# Patient Record
Sex: Male | Born: 1980 | Race: Black or African American | Hispanic: Yes | Marital: Married | Smoking: Current every day smoker
Health system: Southern US, Community
[De-identification: ages and names within clinical notes are randomized; demographics above are authoritative.]

## PROBLEM LIST (undated history)

## (undated) HISTORY — PX: OTHER SURGICAL HISTORY: SHX169

---

## 2016-09-25 ENCOUNTER — Emergency Department (HOSPITAL_COMMUNITY)
Admission: EM | Admit: 2016-09-25 | Discharge: 2016-09-25 | Disposition: A | Discharge status: Home / Self Care | Payer: Self-pay | Attending: Emergency Medicine | Admitting: Emergency Medicine

## 2016-09-25 ENCOUNTER — Encounter (HOSPITAL_COMMUNITY): Payer: Self-pay

## 2016-09-25 ENCOUNTER — Emergency Department (HOSPITAL_COMMUNITY): Payer: Self-pay

## 2016-09-25 DIAGNOSIS — X509XXA Other and unspecified overexertion or strenuous movements or postures, initial encounter: Secondary | ICD-10-CM | POA: Insufficient documentation

## 2016-09-25 DIAGNOSIS — R1032 Left lower quadrant pain: Secondary | ICD-10-CM | POA: Insufficient documentation

## 2016-09-25 DIAGNOSIS — M545 Low back pain, unspecified: Secondary | ICD-10-CM

## 2016-09-25 DIAGNOSIS — R1031 Right lower quadrant pain: Secondary | ICD-10-CM | POA: Insufficient documentation

## 2016-09-25 DIAGNOSIS — Y929 Unspecified place or not applicable: Secondary | ICD-10-CM | POA: Insufficient documentation

## 2016-09-25 DIAGNOSIS — Y990 Civilian activity done for income or pay: Secondary | ICD-10-CM | POA: Insufficient documentation

## 2016-09-25 DIAGNOSIS — F17210 Nicotine dependence, cigarettes, uncomplicated: Secondary | ICD-10-CM | POA: Insufficient documentation

## 2016-09-25 DIAGNOSIS — Y939 Activity, unspecified: Secondary | ICD-10-CM | POA: Insufficient documentation

## 2016-09-25 LAB — CBC
HEMATOCRIT: 44.6 % (ref 39.0–52.0)
HEMOGLOBIN: 15.9 g/dL (ref 13.0–17.0)
MCH: 29.7 pg (ref 26.0–34.0)
MCHC: 35.7 g/dL (ref 30.0–36.0)
MCV: 83.4 fL (ref 78.0–100.0)
Platelets: 285 10*3/uL (ref 150–400)
RBC: 5.35 MIL/uL (ref 4.22–5.81)
RDW: 12.4 % (ref 11.5–15.5)
WBC: 5.3 10*3/uL (ref 4.0–10.5)

## 2016-09-25 LAB — URINALYSIS, ROUTINE W REFLEX MICROSCOPIC
Bilirubin Urine: NEGATIVE
GLUCOSE, UA: NEGATIVE mg/dL
Hgb urine dipstick: NEGATIVE
Ketones, ur: NEGATIVE mg/dL
LEUKOCYTES UA: NEGATIVE
NITRITE: NEGATIVE
PROTEIN: NEGATIVE mg/dL
Specific Gravity, Urine: 1.016 (ref 1.005–1.030)
pH: 7 (ref 5.0–8.0)

## 2016-09-25 LAB — BASIC METABOLIC PANEL
ANION GAP: 6 (ref 5–15)
BUN: 13 mg/dL (ref 6–20)
CALCIUM: 9.7 mg/dL (ref 8.9–10.3)
CO2: 28 mmol/L (ref 22–32)
Chloride: 104 mmol/L (ref 101–111)
Creatinine, Ser: 0.84 mg/dL (ref 0.61–1.24)
Glucose, Bld: 98 mg/dL (ref 65–99)
POTASSIUM: 4.1 mmol/L (ref 3.5–5.1)
Sodium: 138 mmol/L (ref 135–145)

## 2016-09-25 MED ORDER — CYCLOBENZAPRINE HCL 10 MG PO TABS: 10.0000 mg | ORAL_TABLET | Freq: Two times a day (BID) | ORAL | 0 refills | Status: DC | PRN

## 2016-09-25 MED ORDER — MORPHINE SULFATE (PF) 4 MG/ML IV SOLN
4.0000 mg | Freq: Once | INTRAVENOUS | Status: AC
  Administered 2016-09-25: 4 mg via INTRAVENOUS
  Filled 2016-09-25: qty 1

## 2016-09-25 MED ORDER — IOPAMIDOL (ISOVUE-300) INJECTION 61%
100.0000 mL | Freq: Once | INTRAVENOUS | Status: AC | PRN
  Administered 2016-09-25: 100 mL via INTRAVENOUS

## 2016-09-25 MED ORDER — TRAMADOL HCL 50 MG PO TABS: 50.0000 mg | ORAL_TABLET | Freq: Four times a day (QID) | ORAL | 0 refills | Status: DC | PRN

## 2016-09-25 MED ORDER — CYCLOBENZAPRINE HCL 10 MG PO TABS
5.0000 mg | ORAL_TABLET | Freq: Once | ORAL | Status: AC
  Administered 2016-09-25: 5 mg via ORAL
  Filled 2016-09-25: qty 1

## 2016-09-25 MED ORDER — IOPAMIDOL (ISOVUE-300) INJECTION 61%
INTRAVENOUS | Status: AC
  Filled 2016-09-25: qty 100

## 2016-09-25 MED ORDER — KETOROLAC TROMETHAMINE 15 MG/ML IJ SOLN
30.0000 mg | Freq: Once | INTRAMUSCULAR | Status: AC
  Administered 2016-09-25: 30 mg via INTRAVENOUS
  Filled 2016-09-25: qty 2

## 2016-09-25 NOTE — ED Provider Notes (Signed)
[facility]-EMERGENCY DEPT Provider Note   CSN: [id number] Arrival date & time: 09/25/16  1123     History   Chief Complaint Chief Complaint  Patient presents with  . Back Pain    HPI Johnny Kaufman is a 36 y.o. male.  HPI   36 year old male presents today with complaints of back and abdominal pain.  Patient reports symptoms started approximately 2 days ago.  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  Patient notes the symptoms radiate around to the right lower abdomen also left lower abdomen.  Patient notes symptoms are worse with bending or standing.  He denies any distal neurological deficits.  He denies any dysuria, notes minor pain after urination.  He denies any penile discharge or significant testicular pain.  Patient notes that he has been able to eat, but has a decreased appetite.  Patient notes he has had these symptoms in the past, but they were not as severe.  He notes this is located of the right lower back.    No past medical history on file.  There are no active problems to display for this patient.   No past surgical history on file.     Home Medications    Prior to Admission medications   Medication Sig Start Date End Date Taking? Authorizing Provider  vitamin C (ASCORBIC ACID) 500 MG tablet Take 500 mg by mouth daily.   Yes Historical Provider, MD  cyclobenzaprine (FLEXERIL) 10 MG tablet Take 1 tablet (10 mg total) by mouth 2 (two) times daily as needed for muscle spasms. 09/25/16   Eyvonne Mechanic, PA-C  traMADol (ULTRAM) 50 MG tablet Take 1 tablet (50 mg total) by mouth every 6 (six) hours as needed. 09/25/16   Eyvonne Mechanic, PA-C    Family History No family history on file.  Social History Social History  Substance Use Topics  . Smoking status: Current Every Day Smoker    Packs/day: 0.50    Types: Cigarettes  . Smokeless tobacco: Not on file  . Alcohol use Yes     Comment: occasionally      Allergies   Patient has no known allergies.   Review of Systems Review of Systems  All other systems reviewed and are negative.    Physical Exam Updated Vital Signs BP 119/84 (BP Location: Right Arm)   Pulse 79   Resp 16   Ht 5\' 11"  (1.803 m)   Wt 86.2 kg   SpO2 100%   BMI 26.50 kg/m   Physical Exam  Constitutional: He is oriented to person, place, and time. He appears well-developed and well-nourished.  HENT:  Head: Normocephalic and atraumatic.  Eyes: Conjunctivae are normal. Pupils are equal, round, and reactive to light. Right eye exhibits no discharge. Left eye exhibits no discharge. No scleral icterus.  Neck: Normal range of motion. No JVD present. No tracheal deviation present.  Pulmonary/Chest: Effort normal. No stridor.  Abdominal:  Tenderness to palpation of the bilateral lower abdominal region  Musculoskeletal:  No CT or L-spine tenderness.  Exquisite tenderness to palpation of the right lateral lumbar soft tissue.  Full active range of motion of the lower extremities, distal sensation strength and motor function intact.  Neurological: He is alert and oriented to person, place, and time. Coordination normal.  Skin: Skin is warm.  Psychiatric: He has a normal mood and affect. His behavior is normal. Judgment and thought content normal.  Nursing note and  vitals reviewed.    ED Treatments / Results  Labs (all labs ordered are listed, but only abnormal results are displayed) Labs Reviewed  URINALYSIS, ROUTINE W REFLEX MICROSCOPIC  BASIC METABOLIC PANEL  CBC    EKG  EKG Interpretation None       Radiology Ct Abdomen Pelvis W Contrast  Result Date: 09/25/2016 CLINICAL DATA:  Right groin and low back pain EXAM: CT ABDOMEN AND PELVIS WITH CONTRAST TECHNIQUE: Multidetector CT imaging of the abdomen and pelvis was performed using the standard protocol following bolus administration of intravenous contrast. CONTRAST:  ISOVUE-300 IOPAMIDOL  (ISOVUE-300) INJECTION 61% COMPARISON:  None. FINDINGS: Lower chest: Lung bases demonstrate no acute consolidation or pleural effusion. Normal heart size. Hepatobiliary: No focal liver abnormality is seen. No gallstones, gallbladder wall thickening, or biliary dilatation. Pancreas: Unremarkable. No pancreatic ductal dilatation or surrounding inflammatory changes. Spleen: Normal in size without focal abnormality. Adrenals/Urinary Tract: Adrenal glands are within normal limits. Subcentimeter hypodense lesion upper pole left kidney too small to further characterize. No hydronephrosis. The bladder is normal. Stomach/Bowel: Stomach is within normal limits. Appendix appears normal. No evidence of bowel wall thickening, distention, or inflammatory changes. Vascular/Lymphatic: No significant vascular findings are present. No enlarged abdominal or pelvic lymph nodes. Reproductive: Prostate is unremarkable. Other: No abdominal wall hernia or abnormality. No abdominopelvic ascites. Musculoskeletal: Sclerotic lesion in the left iliac bone, could reflect large bone island. No acute osseous abnormality. IMPRESSION: No CT evidence for acute intra-abdominal or pelvic pathology. Normal appendix. Subcentimeter hypodense lesion left kidney, too small to further characterize. Electronically Signed   By: Jasmine Pang M.D.   On: 09/25/2016 15:33    Procedures Procedures (including critical care time)  Medications Ordered in ED Medications  iopamidol (ISOVUE-300) 61 % injection (not administered)  ketorolac (TORADOL) 15 MG/ML injection 30 mg (not administered)  cyclobenzaprine (FLEXERIL) tablet 5 mg (not administered)  morphine 4 MG/ML injection 4 mg (4 mg Intravenous Given 09/25/16 1355)  iopamidol (ISOVUE-300) 61 % injection 100 mL (100 mLs Intravenous Contrast Given 09/25/16 1513)     Initial Impression / Assessment and Plan / ED Course  I have reviewed the triage vital signs and the nursing notes.  Pertinent labs &  imaging results that were available during my care of the patient were reviewed by me and considered in my medical decision making (see chart for details).     Final Clinical Impressions(s) / ED Diagnoses   Final diagnoses:  Acute right-sided low back pain without sciatica    36 year old male presents today with complaints of back pain.  Patient is having abdominal complaints as well.  His urinalysis is normal, CT scan shows no significant findings.  Patient's presentation is likely muscular back pain with radiation to his abdomen.  He has no distal neurological deficits, no red flags for back pain.  Patient's pain is significantly improved here with medications.  He will be discharged home with symptomatic care instructions, medication, close follow-up with orthopedics if symptoms persist, strict return precautions.  Both patient and his visitor verbalized understanding and agreement to today's plan had no further questions or concerns at the time discharge  New Prescriptions New Prescriptions   CYCLOBENZAPRINE (FLEXERIL) 10 MG TABLET    Take 1 tablet (10 mg total) by mouth 2 (two) times daily as needed for muscle spasms.   TRAMADOL (ULTRAM) 50 MG TABLET    Take 1 tablet (50 mg total) by mouth every 6 (six) hours as needed.     Tinnie Gens  Laquita Harlan, PA-C 09/25/16 1609    Gwyneth SproutWhitney Plunkett, MD 09/26/16 1627

## 2016-09-25 NOTE — ED Notes (Signed)
Per translator Harriett Sine (spanish) via [employer]: Pt has had pain to his RT low back and RT groin area.  States it has been an issue for a while but has gotten worse.  Denies pain w/urination but has burning after finished urinating x last 2 days.  Denies penile d/c but c/o blood in stool most times he has a BM.  States otherwise he has a BM daily, last one yesterday w/red blood in it.  States he drinks mostly soda, rarely drinks water.   He walks with a limp, states it is due to the pain.  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

## 2016-09-25 NOTE — Discharge Instructions (Signed)
Please read attached information. If you experience any new or worsening signs or symptoms please return to the emergency room for evaluation. Please follow-up with your primary care provider or specialist as discussed. Please use medication prescribed only as directed and discontinue taking if you have any concerning signs or symptoms.   °

## 2017-08-24 ENCOUNTER — Other Ambulatory Visit: Payer: Self-pay

## 2017-08-24 ENCOUNTER — Emergency Department (HOSPITAL_COMMUNITY): Payer: BLUE CROSS/BLUE SHIELD

## 2017-08-24 ENCOUNTER — Emergency Department (HOSPITAL_COMMUNITY)
Admission: EM | Admit: 2017-08-24 | Discharge: 2017-08-24 | Disposition: A | Discharge status: Home / Self Care | Payer: BLUE CROSS/BLUE SHIELD | Attending: Emergency Medicine | Admitting: Emergency Medicine

## 2017-08-24 ENCOUNTER — Encounter (HOSPITAL_COMMUNITY): Payer: Self-pay | Admitting: *Deleted

## 2017-08-24 DIAGNOSIS — Y939 Activity, unspecified: Secondary | ICD-10-CM | POA: Diagnosis not present

## 2017-08-24 DIAGNOSIS — Y999 Unspecified external cause status: Secondary | ICD-10-CM | POA: Insufficient documentation

## 2017-08-24 DIAGNOSIS — Y929 Unspecified place or not applicable: Secondary | ICD-10-CM | POA: Insufficient documentation

## 2017-08-24 DIAGNOSIS — S39012A Strain of muscle, fascia and tendon of lower back, initial encounter: Secondary | ICD-10-CM

## 2017-08-24 DIAGNOSIS — X58XXXA Exposure to other specified factors, initial encounter: Secondary | ICD-10-CM | POA: Diagnosis not present

## 2017-08-24 DIAGNOSIS — F17210 Nicotine dependence, cigarettes, uncomplicated: Secondary | ICD-10-CM | POA: Diagnosis not present

## 2017-08-24 DIAGNOSIS — M545 Low back pain: Secondary | ICD-10-CM | POA: Diagnosis present

## 2017-08-24 LAB — URINALYSIS, ROUTINE W REFLEX MICROSCOPIC
BILIRUBIN URINE: NEGATIVE
Glucose, UA: NEGATIVE mg/dL
HGB URINE DIPSTICK: NEGATIVE
Ketones, ur: NEGATIVE mg/dL
Leukocytes, UA: NEGATIVE
NITRITE: NEGATIVE
PH: 7 (ref 5.0–8.0)
Protein, ur: NEGATIVE mg/dL
SPECIFIC GRAVITY, URINE: 1.016 (ref 1.005–1.030)

## 2017-08-24 MED ORDER — KETOROLAC TROMETHAMINE 30 MG/ML IJ SOLN
15.0000 mg | Freq: Once | INTRAMUSCULAR | Status: AC
  Administered 2017-08-24: 15 mg via INTRAMUSCULAR
  Filled 2017-08-24: qty 1

## 2017-08-24 MED ORDER — MELOXICAM 7.5 MG PO TABS: 7.5000 mg | ORAL_TABLET | Freq: Every day | ORAL | 0 refills | Status: AC

## 2017-08-24 NOTE — Discharge Instructions (Signed)
Please be sure to schedule an appointment with our clinic to ensure that your condition is improving.  Return here for concerning changes in your condition.

## 2017-08-24 NOTE — ED Triage Notes (Signed)
Lower back pain x 2 weeks. Pain varies from mild to severe. Urinary difficulty at times
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
## 2017-08-26 ENCOUNTER — Encounter: Payer: Self-pay | Admitting: Gastroenterology

## 2017-09-05 ENCOUNTER — Emergency Department (HOSPITAL_COMMUNITY)
Admission: EM | Admit: 2017-09-05 | Discharge: 2017-09-05 | Disposition: A | Discharge status: Home / Self Care | Payer: Worker's Compensation | Attending: Emergency Medicine | Admitting: Emergency Medicine

## 2017-09-05 ENCOUNTER — Encounter (HOSPITAL_COMMUNITY): Payer: Self-pay | Admitting: Obstetrics and Gynecology

## 2017-09-05 ENCOUNTER — Other Ambulatory Visit: Payer: Self-pay

## 2017-09-05 ENCOUNTER — Emergency Department (HOSPITAL_COMMUNITY): Payer: Worker's Compensation

## 2017-09-05 DIAGNOSIS — Y9389 Activity, other specified: Secondary | ICD-10-CM | POA: Diagnosis not present

## 2017-09-05 DIAGNOSIS — Y9259 Other trade areas as the place of occurrence of the external cause: Secondary | ICD-10-CM | POA: Insufficient documentation

## 2017-09-05 DIAGNOSIS — M25561 Pain in right knee: Secondary | ICD-10-CM | POA: Diagnosis not present

## 2017-09-05 DIAGNOSIS — Y990 Civilian activity done for income or pay: Secondary | ICD-10-CM | POA: Insufficient documentation

## 2017-09-05 DIAGNOSIS — S8991XA Unspecified injury of right lower leg, initial encounter: Secondary | ICD-10-CM | POA: Diagnosis present

## 2017-09-05 MED ORDER — IBUPROFEN 800 MG PO TABS
800.0000 mg | ORAL_TABLET | Freq: Once | ORAL | Status: AC
  Administered 2017-09-05: 800 mg via ORAL
  Filled 2017-09-05: qty 1

## 2017-09-05 NOTE — Discharge Instructions (Signed)
Toma ibuprofen 3 veces cada dia.Puede tomar 800 mg cada tiempo. No toma otros medicinas como advil, motrin, naproxen, aleve.  Puede tomar tylenol tambien. Puede tomar 1000 mg cada tiempo, 3 veces cada dia.  [place] hielo, 20 minutos, 3 veces cada dia.  Da [place] Dearuna cita con el doctor ortopedica si el dolor no mejora en [employer]H. Robinson World[employer]una semana.  Regresa a la sala de emergencia si el dolor peora, si no tiene sensation de pierna, si hay cambia de color de piel, o si hay otros problemas nuevos.   Take ibuprofen 3 times a day with meals.  Do not take other anti-inflammatories at the same time open (Advil, Motrin, naproxen, Aleve). You may supplement with Tylenol if you need further pain control. Use ice packs, 20 minutes at a time, 3 times a Futures traderda.  Follow-up with the orthopedic doctor in 1 week if your pain is not improving. Return to the emergency room if you develop worsening pain, numbness, color change, or any new or concerning symptoms.
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
## 2017-10-16 ENCOUNTER — Ambulatory Visit: Payer: BLUE CROSS/BLUE SHIELD | Admitting: Gastroenterology

## 2017-12-16 ENCOUNTER — Encounter (HOSPITAL_COMMUNITY): Payer: Self-pay

## 2017-12-16 ENCOUNTER — Other Ambulatory Visit: Payer: Self-pay

## 2017-12-16 ENCOUNTER — Emergency Department (HOSPITAL_COMMUNITY)
Admission: EM | Admit: 2017-12-16 | Discharge: 2017-12-16 | Disposition: A | Discharge status: Home / Self Care | Payer: BLUE CROSS/BLUE SHIELD | Attending: Emergency Medicine | Admitting: Emergency Medicine

## 2017-12-16 DIAGNOSIS — R51 Headache: Secondary | ICD-10-CM | POA: Insufficient documentation

## 2017-12-16 DIAGNOSIS — Z5321 Procedure and treatment not carried out due to patient leaving prior to being seen by health care provider: Secondary | ICD-10-CM | POA: Insufficient documentation

## 2017-12-16 DIAGNOSIS — R109 Unspecified abdominal pain: Secondary | ICD-10-CM | POA: Diagnosis not present

## 2017-12-16 LAB — CBC
HEMATOCRIT: 45.8 % (ref 39.0–52.0)
Hemoglobin: 16.1 g/dL (ref 13.0–17.0)
MCH: 30.7 pg (ref 26.0–34.0)
MCHC: 35.2 g/dL (ref 30.0–36.0)
MCV: 87.2 fL (ref 78.0–100.0)
Platelets: 187 10*3/uL (ref 150–400)
RBC: 5.25 MIL/uL (ref 4.22–5.81)
RDW: 12 % (ref 11.5–15.5)
WBC: 6.8 10*3/uL (ref 4.0–10.5)

## 2017-12-16 LAB — COMPREHENSIVE METABOLIC PANEL
ALBUMIN: 4.4 g/dL (ref 3.5–5.0)
ALT: 31 U/L (ref 17–63)
AST: 21 U/L (ref 15–41)
Alkaline Phosphatase: 61 U/L (ref 38–126)
Anion gap: 9 (ref 5–15)
BILIRUBIN TOTAL: 0.9 mg/dL (ref 0.3–1.2)
BUN: 13 mg/dL (ref 6–20)
CHLORIDE: 106 mmol/L (ref 101–111)
CO2: 21 mmol/L — AB (ref 22–32)
Calcium: 9.1 mg/dL (ref 8.9–10.3)
Creatinine, Ser: 0.84 mg/dL (ref 0.61–1.24)
GFR calc Af Amer: >60 mL/min (ref >60)
GFR calc non Af Amer: >60 mL/min (ref >60)
GLUCOSE: 110 mg/dL — AB (ref 65–99)
POTASSIUM: 3.6 mmol/L (ref 3.5–5.1)
Sodium: 136 mmol/L (ref 135–145)
TOTAL PROTEIN: 7.1 g/dL (ref 6.5–8.1)

## 2017-12-16 NOTE — ED Triage Notes (Addendum)
patient c/o mid abdominal pain and diarrhea with dark blood on the stool x 3 days Patient also c/o having a headache as well.  Video Interpreter Isabelle CourseEugenia [phone]

## 2017-12-16 NOTE — ED Notes (Signed)
Pt called from the lobby with no response x1 

## 2017-12-16 NOTE — ED Triage Notes (Signed)
Pt called from triage with no answer 

## 2017-12-18 LAB — TYPE AND SCREEN
ABO/RH(D): O POS
Antibody Screen: NEGATIVE

## 2017-12-18 LAB — ABO/RH: ABO/RH(D): O POS

## 2018-02-04 ENCOUNTER — Ambulatory Visit
Admission: RE | Admit: 2018-02-04 | Discharge: 2018-02-04 | Disposition: A | Discharge status: Home / Self Care | Payer: BLUE CROSS/BLUE SHIELD | Source: Ambulatory Visit | Attending: Family Medicine | Admitting: Family Medicine

## 2018-02-04 ENCOUNTER — Other Ambulatory Visit: Payer: Self-pay | Admitting: Family Medicine

## 2018-02-04 DIAGNOSIS — M545 Low back pain: Secondary | ICD-10-CM

## 2018-02-16 IMAGING — CT CT ABD-PELV W/ CM
2 of 4 series · 16 of 46 positions shown, 18 images · IV contrast (ISOVUE)
Comparison: None.

CLINICAL DATA: Right groin and low back pain

EXAM:
CT ABDOMEN AND PELVIS WITH CONTRAST
TECHNIQUE: Multidetector CT imaging of the abdomen and pelvis was performed
using the standard protocol following bolus administration of
intravenous contrast.
CONTRAST:  100mL QH7ZOA-GYY IOPAMIDOL (QH7ZOA-GYY) INJECTION 61%

[Series 2: abd/pel with · axial · 0.79mm/px · z∈[+843,+1263]mm · 13 of 94 slices shown, 15 images]
[im 5/94  soft-tissue]
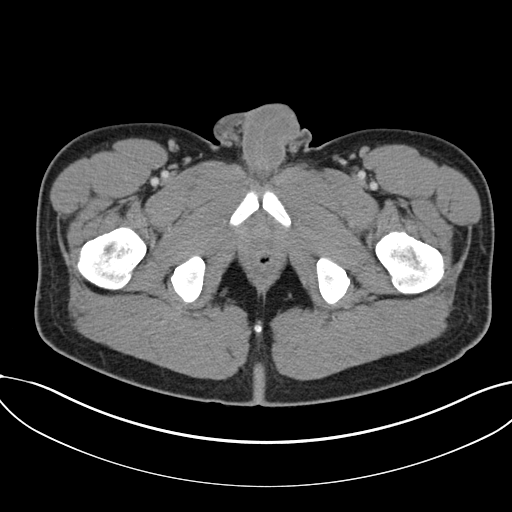
[im 5/94  bone]
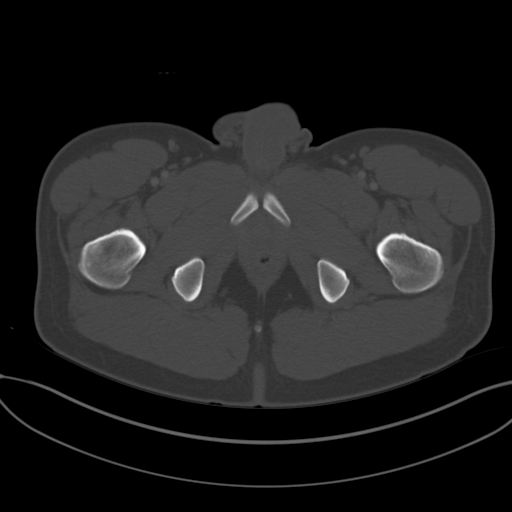
[im 14/94  soft-tissue]
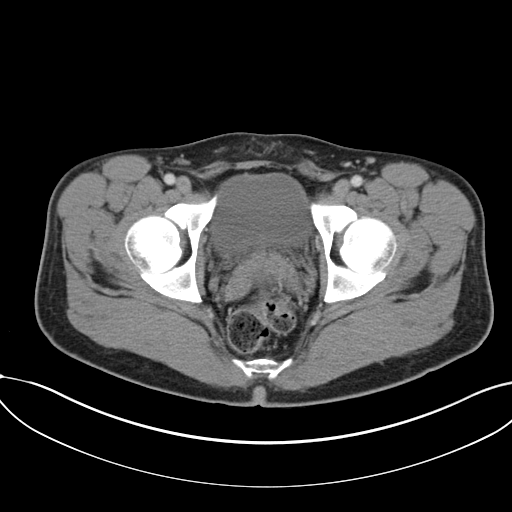
[im 19/94  soft-tissue]
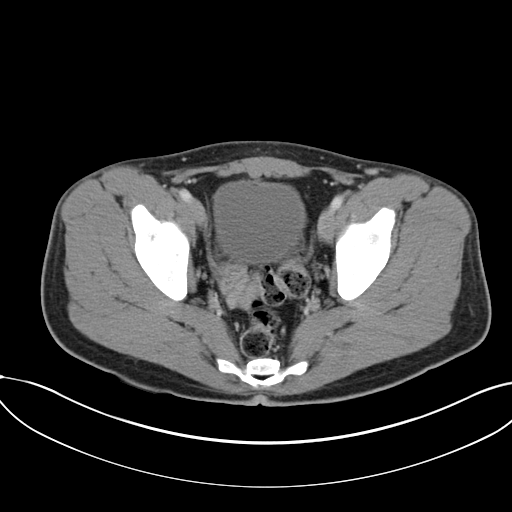
[im 28/94  soft-tissue]
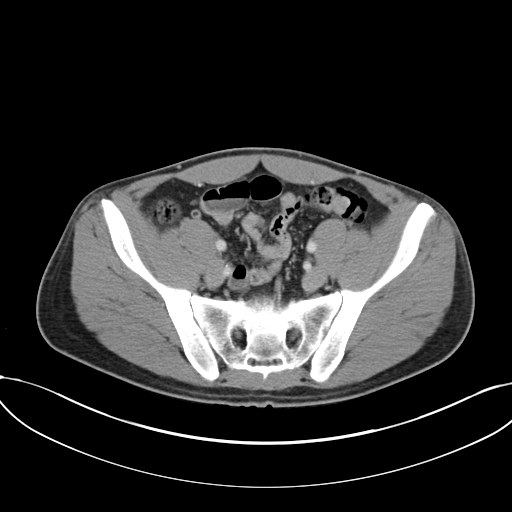
[im 33/94  soft-tissue]
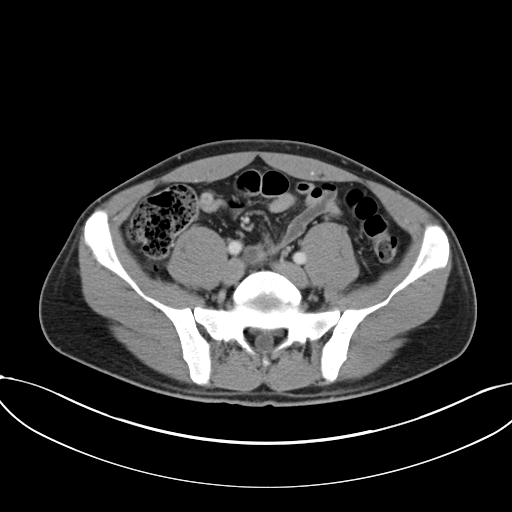
[im 42/94  soft-tissue]
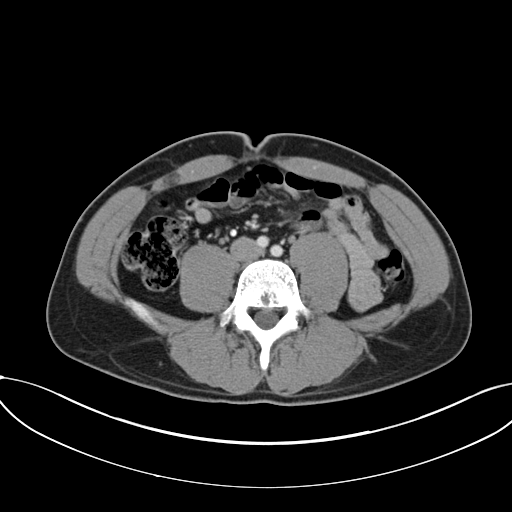
[im 47/94  soft-tissue]
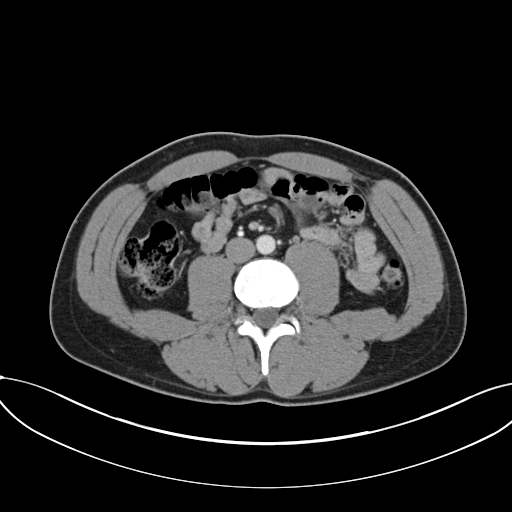
[im 52/94  soft-tissue]
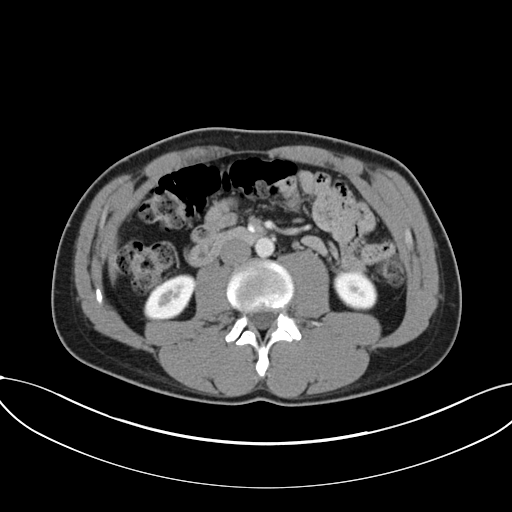
[im 61/94  soft-tissue]
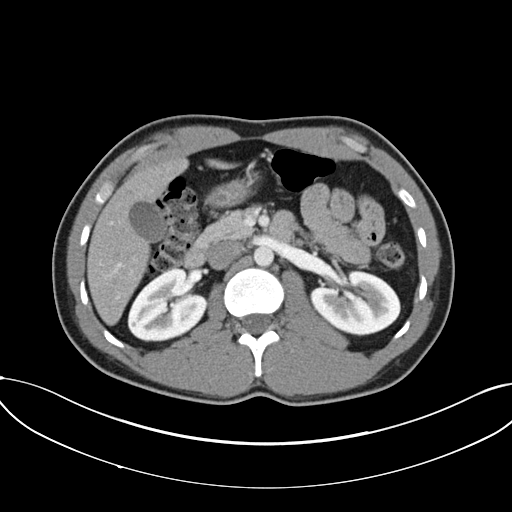
[im 61/94  bone]
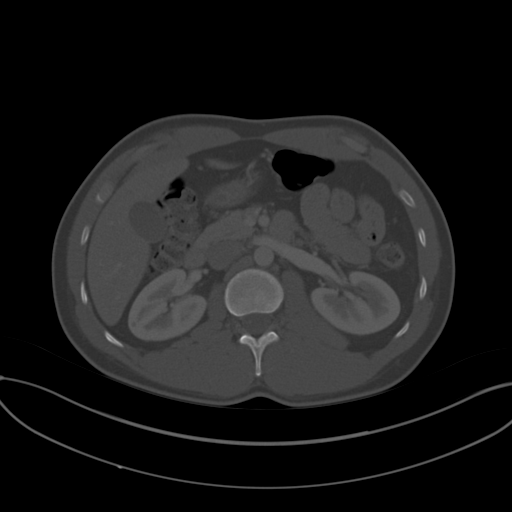
[im 66/94  soft-tissue]
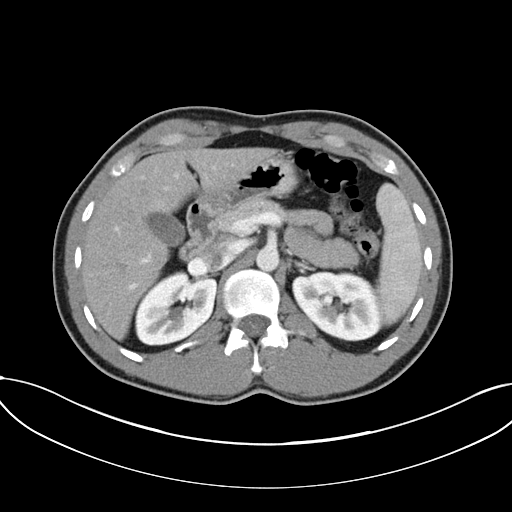
[im 75/94  soft-tissue]
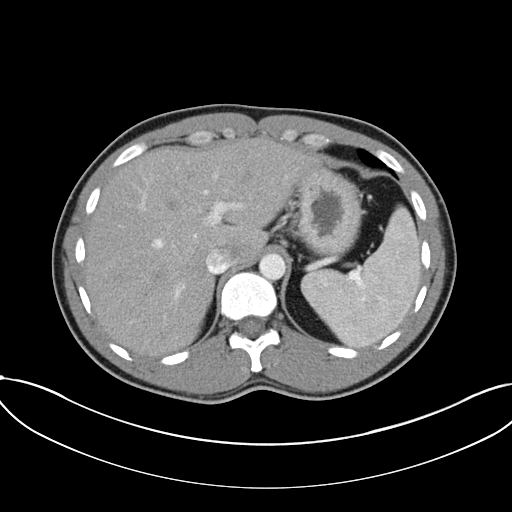
[im 80/94  soft-tissue]
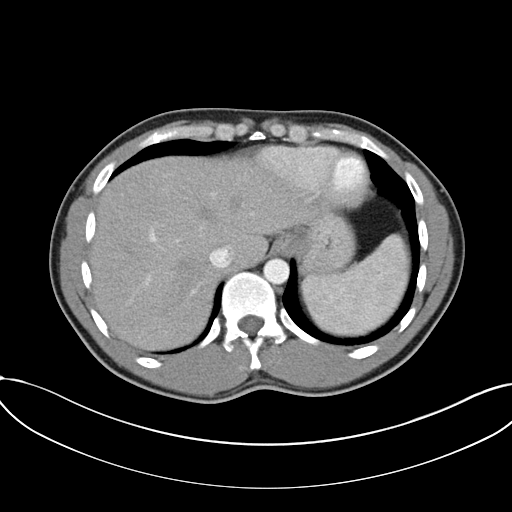
[im 89/94  soft-tissue]
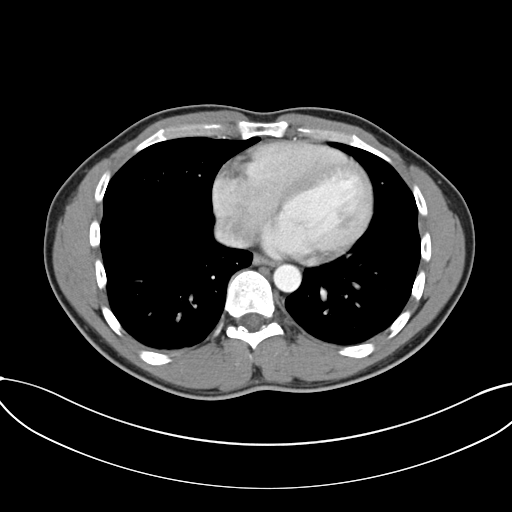

[Series 3: coronal a/|p · coronal · 0.78mm/px · 3 of 119 slices shown]
[im 40/119  soft-tissue]
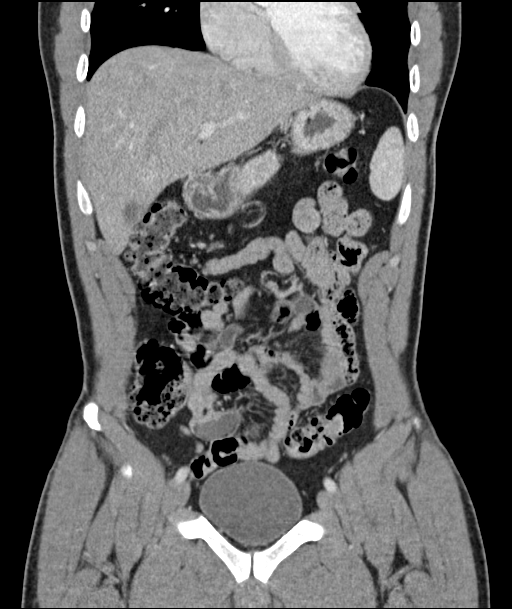
[im 53/119  soft-tissue]
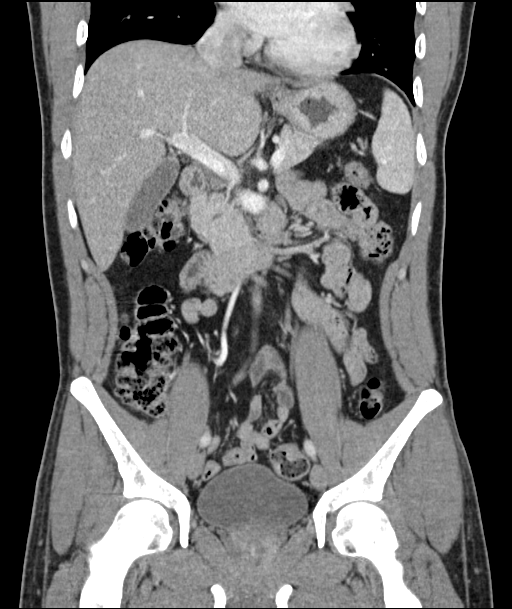
[im 66/119  soft-tissue]
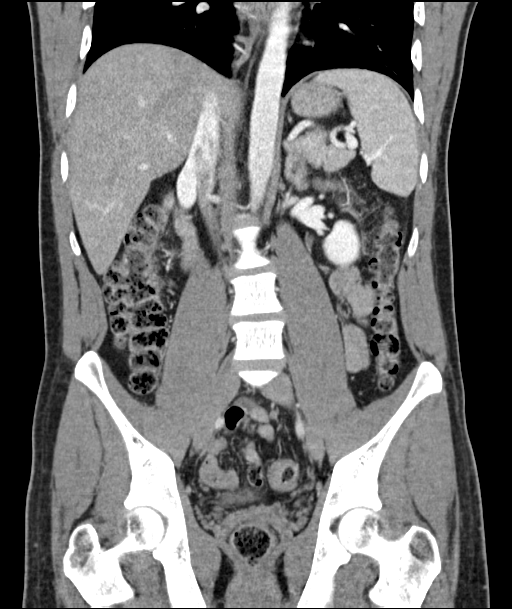

[16 of 46 positions shown; findings below may reference images not displayed]

FINDINGS: Lower chest: Lung bases demonstrate no acute consolidation or
pleural effusion. Normal heart size.

Hepatobiliary: No focal liver abnormality is seen. No gallstones,
gallbladder wall thickening, or biliary dilatation.

Pancreas: Unremarkable. No pancreatic ductal dilatation or
surrounding inflammatory changes.

Spleen: Normal in size without focal abnormality.

Adrenals/Urinary Tract: Adrenal glands are within normal limits.
Subcentimeter hypodense lesion upper pole left kidney too small to
further characterize. No hydronephrosis. The bladder is normal.

Stomach/Bowel: Stomach is within normal limits. Appendix appears
normal. No evidence of bowel wall thickening, distention, or
inflammatory changes.

Vascular/Lymphatic: No significant vascular findings are present. No
enlarged abdominal or pelvic lymph nodes.

Reproductive: Prostate is unremarkable.

Other: No abdominal wall hernia or abnormality. No abdominopelvic
ascites.

Musculoskeletal: Sclerotic lesion in the left iliac bone, could
reflect large bone island. No acute osseous abnormality.
IMPRESSION: No CT evidence for acute intra-abdominal or pelvic pathology. Normal
appendix.

Subcentimeter hypodense lesion left kidney, too small to further
characterize.

## 2019-10-21 ENCOUNTER — Other Ambulatory Visit: Payer: Self-pay

## 2019-10-21 ENCOUNTER — Encounter (HOSPITAL_COMMUNITY): Payer: Self-pay | Admitting: Emergency Medicine

## 2019-10-21 ENCOUNTER — Emergency Department (HOSPITAL_COMMUNITY)
Admission: EM | Admit: 2019-10-21 | Discharge: 2019-10-21 | Disposition: A | Discharge status: Home / Self Care | Payer: Self-pay | Attending: Emergency Medicine | Admitting: Emergency Medicine

## 2019-10-21 ENCOUNTER — Emergency Department (HOSPITAL_COMMUNITY): Payer: Self-pay

## 2019-10-21 DIAGNOSIS — R0789 Other chest pain: Secondary | ICD-10-CM | POA: Diagnosis not present

## 2019-10-21 DIAGNOSIS — R079 Chest pain, unspecified: Secondary | ICD-10-CM | POA: Diagnosis present

## 2019-10-21 DIAGNOSIS — M79601 Pain in right arm: Secondary | ICD-10-CM | POA: Insufficient documentation

## 2019-10-21 DIAGNOSIS — F17210 Nicotine dependence, cigarettes, uncomplicated: Secondary | ICD-10-CM | POA: Diagnosis not present

## 2019-10-21 DIAGNOSIS — M542 Cervicalgia: Secondary | ICD-10-CM | POA: Insufficient documentation

## 2019-10-21 DIAGNOSIS — Z79899 Other long term (current) drug therapy: Secondary | ICD-10-CM | POA: Diagnosis not present

## 2019-10-21 MED ORDER — METHOCARBAMOL 500 MG PO TABS: 500.0000 mg | ORAL_TABLET | Freq: Every evening | ORAL | 0 refills | Status: AC | PRN
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
## 2019-10-21 NOTE — Discharge Instructions (Signed)
Take naproxen 2 times a day with meals.  Do not take other anti-inflammatories at the same time (Advil, Motrin, ibuprofen, Aleve). You may supplement with Tylenol if you need further pain control. °Use robaxin as needed for muscle stiffness or soreness.  Have caution, this may make you tired or groggy.  Do not drive or operate heavy machinery while taking this medicine. °Use ice packs or heating pads if this helps control your pain. °You will likely have continued muscle stiffness and soreness over the next couple days.  Follow-up with primary care in 1 week if your symptoms are not improving. °Return to the emergency room if you develop vision changes, vomiting, slurred speech, numbness, loss of bowel or bladder control, or any new or worsening symptoms. ° °
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
## 2023-04-08 ENCOUNTER — Ambulatory Visit: Payer: Self-pay

## 2023-04-08 NOTE — Telephone Encounter (Signed)
Summary: Back Pain 8/10 Advice Called patient no Answer   Pts spouse called reporting that the patient was having back pain 8/10. Scheduled patient new patient appt at [facility] next Monday. Attempted to call the patient with pac[employer] interpret[employer] on the line for the patient to be triaged. Patient did not answer the phone. Advised wife that a nurse would call the patient back.      Using Spanish interpreter [facility] # [phone].

## 2023-04-08 NOTE — Telephone Encounter (Signed)
Second attempt to reach pt., left Vm to call back. Assisted by Shellia Cleverly [phone]

## 2023-04-08 NOTE — Telephone Encounter (Signed)
Third attempt to reach pt. Used Spanish interpreter [place], # [phone]. Left message to call back.
